# Patient Record
Sex: Male | Born: 2000 | Race: Black or African American | Hispanic: No | Marital: Single | Smoking: Never smoker
Health system: Southern US, Community
[De-identification: ages and names within clinical notes are randomized; demographics above are authoritative.]

---
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
## 2021-07-11 IMAGING — DX DG HAND COMPLETE 3+V*R*
3 series · 3 of 3 positions shown · non-contrast
Comparison: Plain films of the right hand earlier today.

CLINICAL DATA: Right hand pain since the patient suffered a fifth
metacarpal fracture when he punched a wall several weeks ago.

EXAM:
RIGHT HAND - COMPLETE 3+ VIEW

[hand obl (1 of 2)]
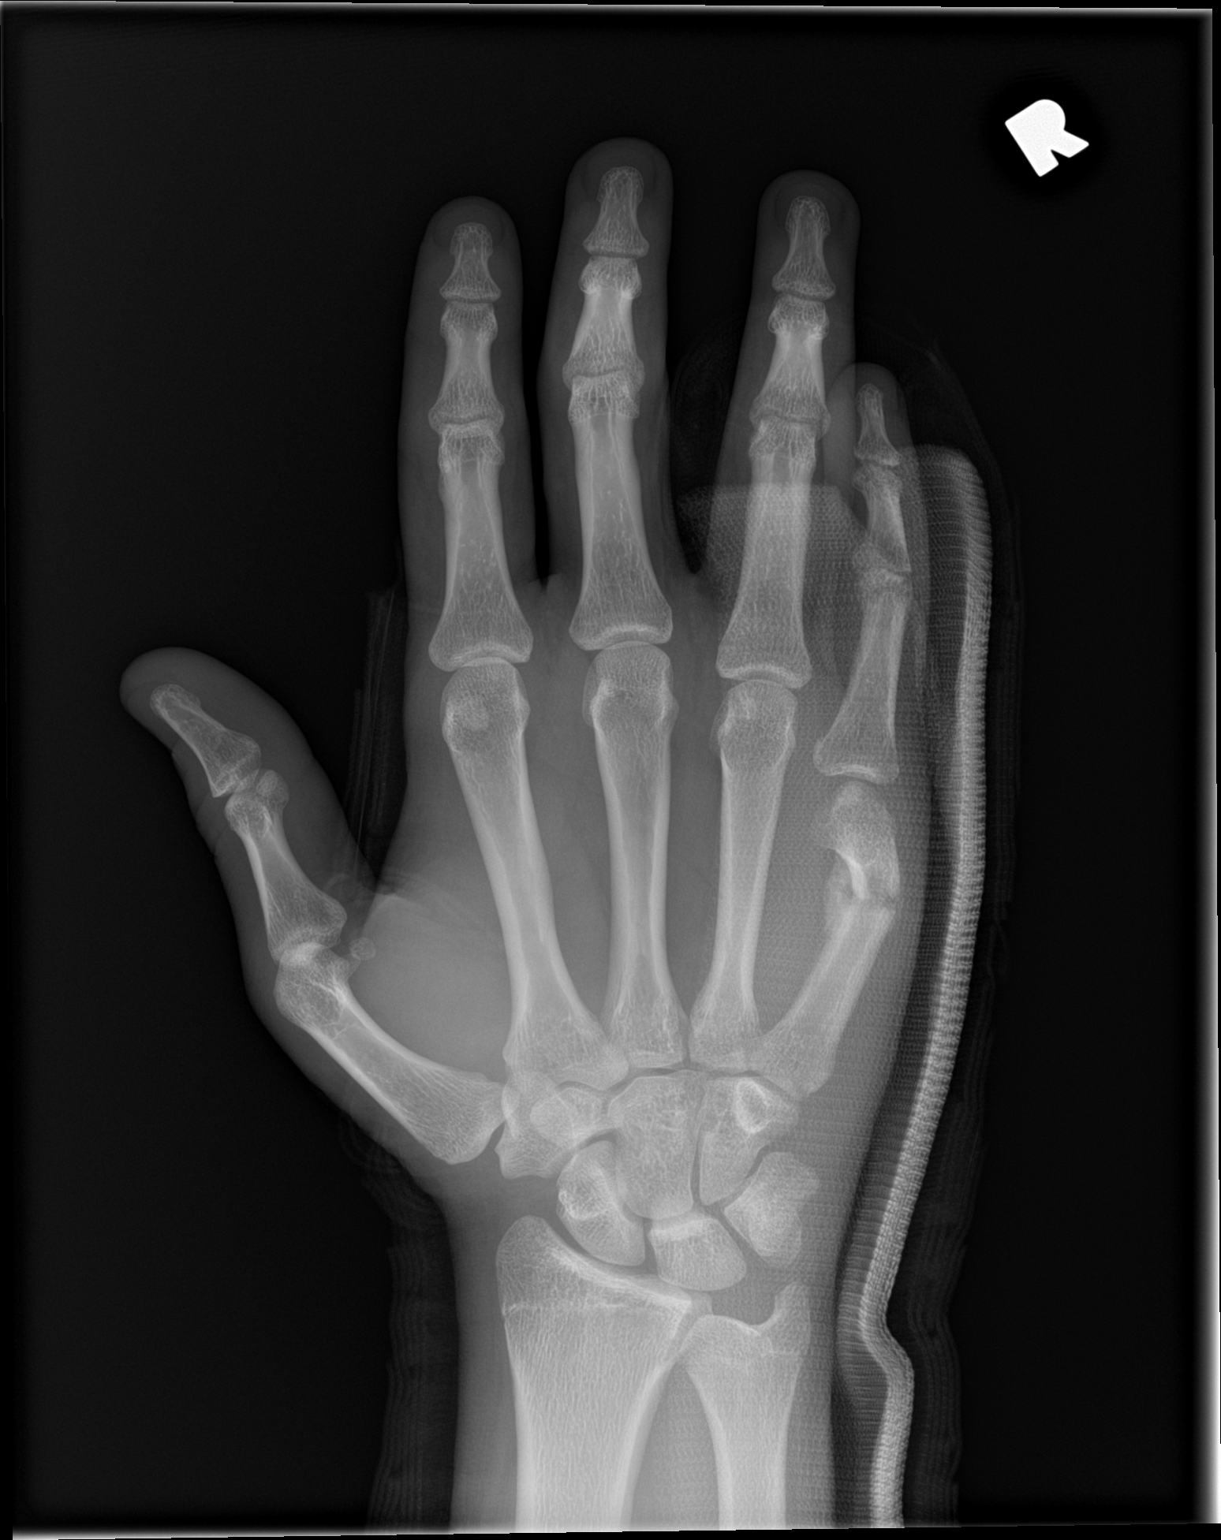

[hand lat]
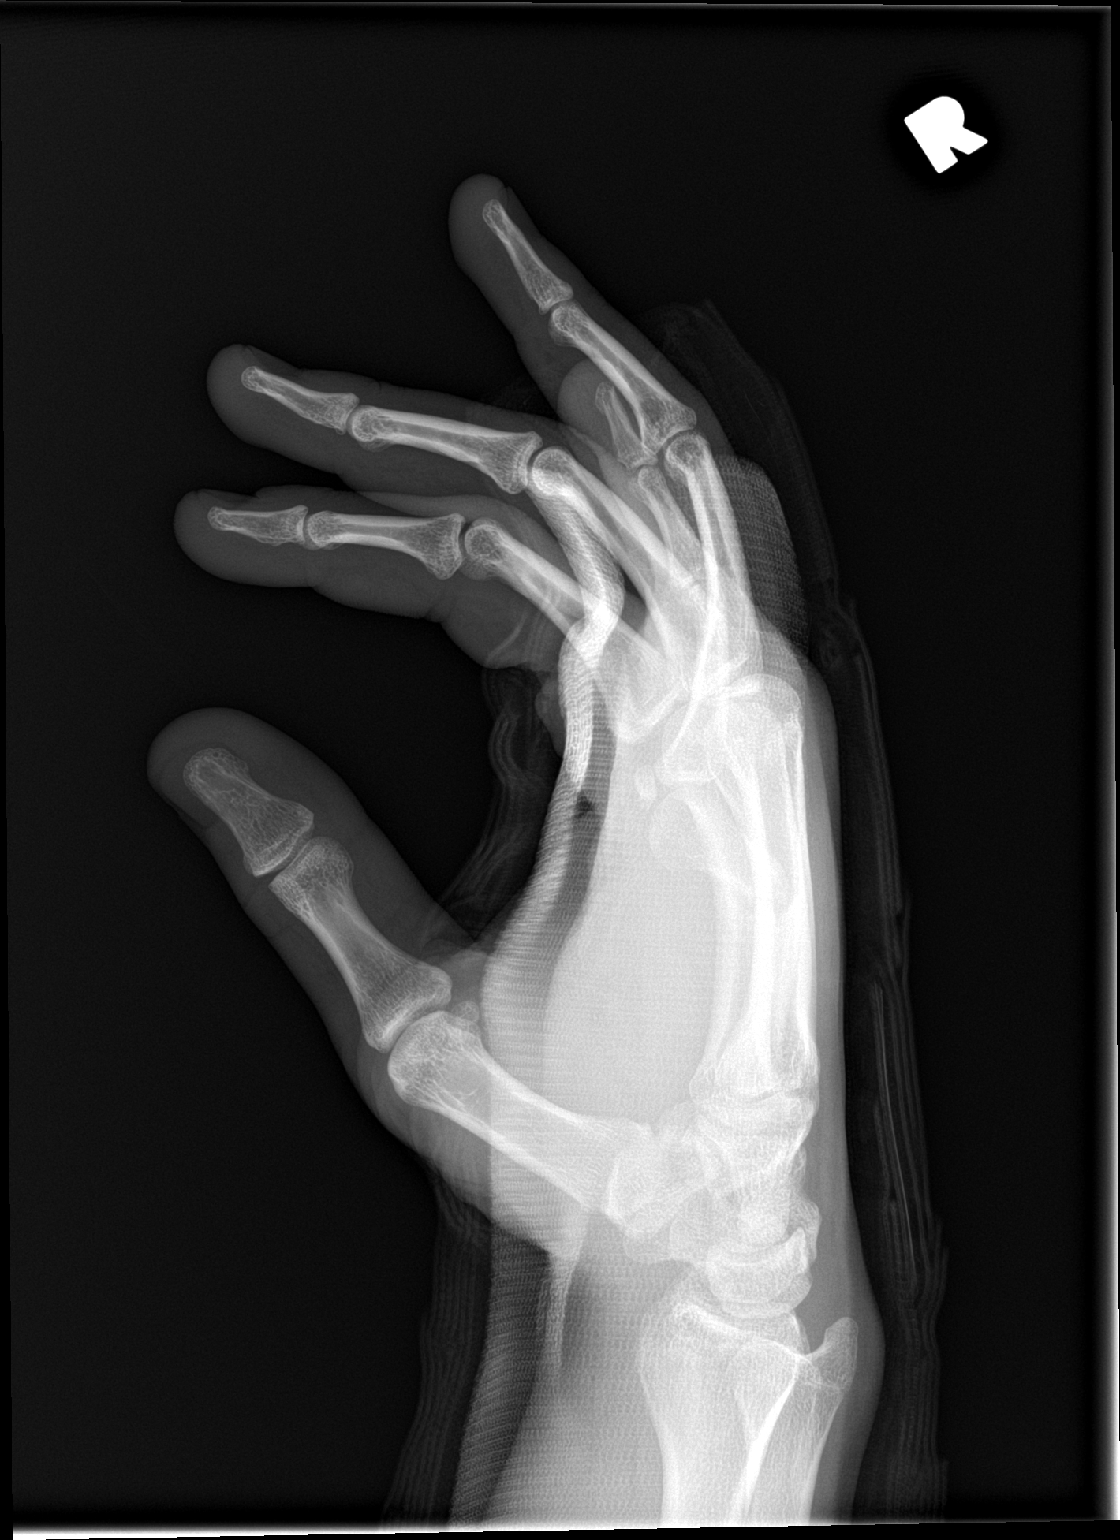

[hand obl (2 of 2)]
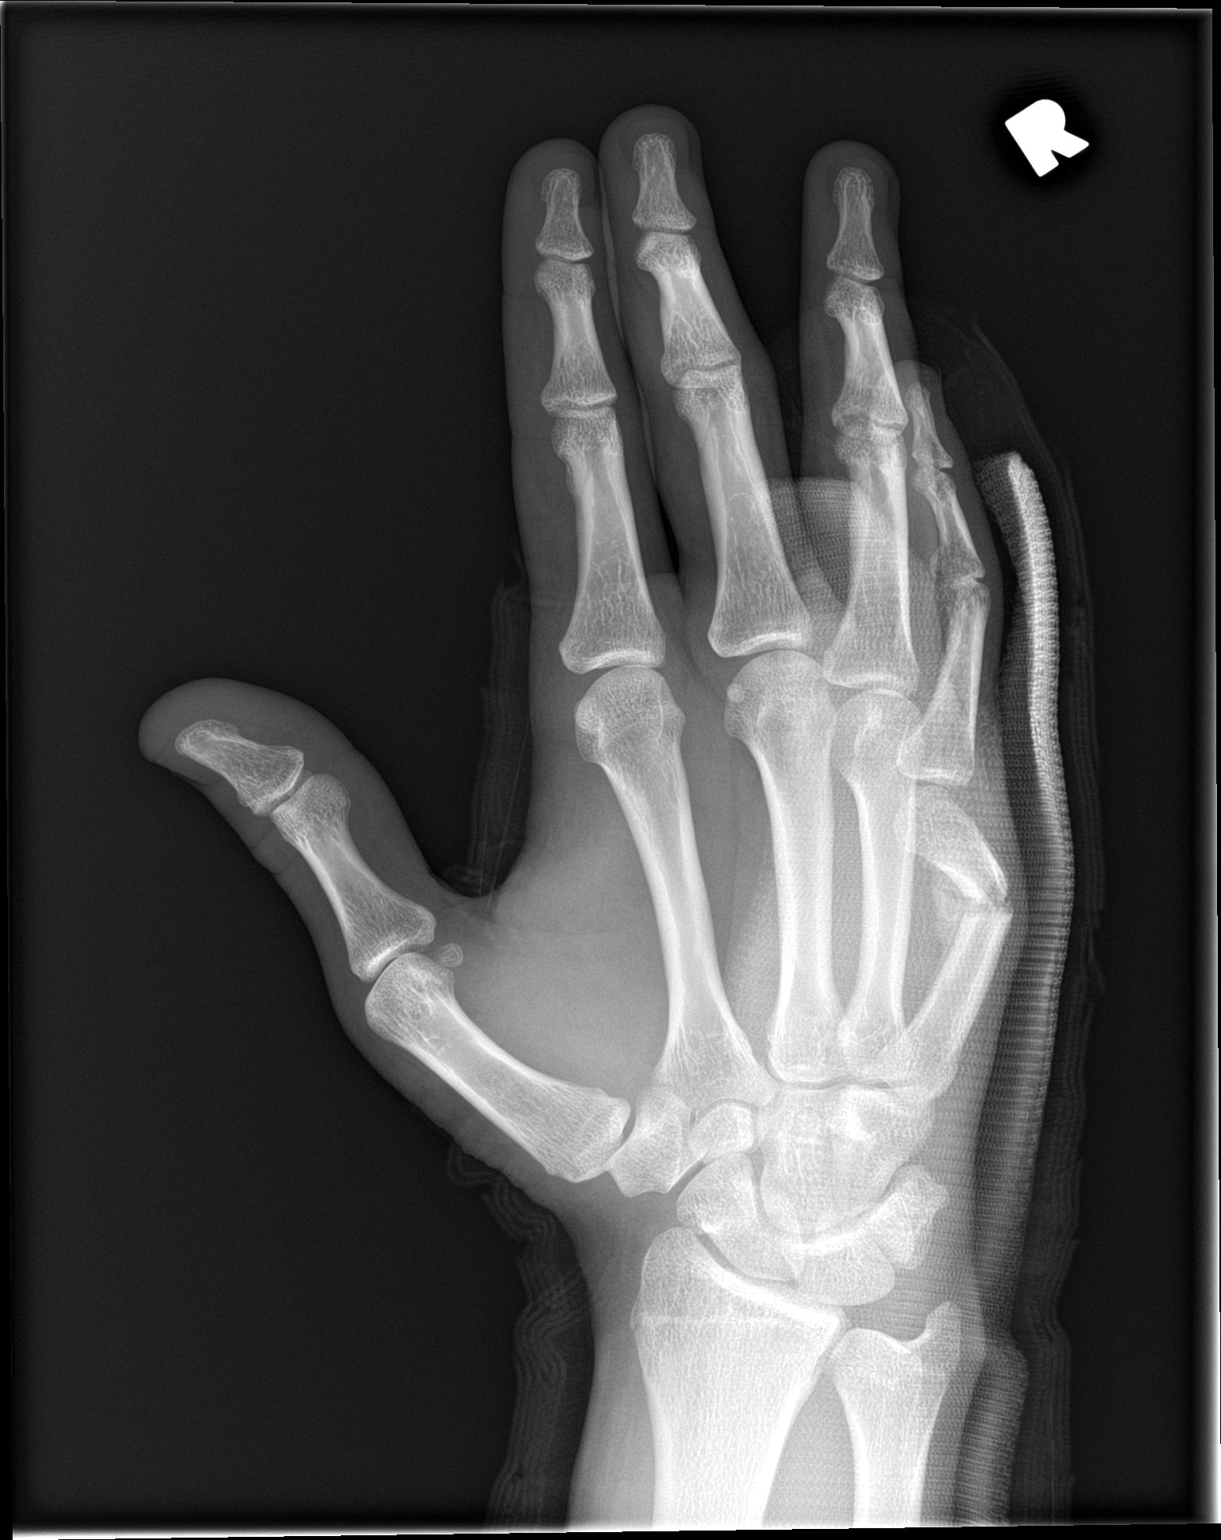

[3 of 3 positions shown; findings below may reference images not displayed]

FINDINGS: Fiberglass splint is now in place. Position and alignment of the
patient's fracture of the distal fifth metacarpal are unchanged.
Callus formation again seen.
IMPRESSION: Fiberglass splint now in place. No change in position or alignment
of the patient's fifth metacarpal fracture.

## 2021-08-15 ENCOUNTER — Emergency Department (HOSPITAL_BASED_OUTPATIENT_CLINIC_OR_DEPARTMENT_OTHER)
Admission: EM | Admit: 2021-08-15 | Discharge: 2021-08-15 | Disposition: A | Discharge status: Home / Self Care | Payer: Medicaid Other | Attending: Emergency Medicine | Admitting: Emergency Medicine

## 2021-08-15 ENCOUNTER — Other Ambulatory Visit: Payer: Self-pay

## 2021-08-15 ENCOUNTER — Encounter (HOSPITAL_BASED_OUTPATIENT_CLINIC_OR_DEPARTMENT_OTHER): Payer: Self-pay

## 2021-08-15 DIAGNOSIS — Z5321 Procedure and treatment not carried out due to patient leaving prior to being seen by health care provider: Secondary | ICD-10-CM | POA: Insufficient documentation

## 2021-08-15 DIAGNOSIS — H1031 Unspecified acute conjunctivitis, right eye: Secondary | ICD-10-CM | POA: Insufficient documentation

## 2021-08-15 NOTE — ED Triage Notes (Addendum)
Pt reports multiple family members have recently had conjunctivitis and states now he states he has it. He has been prescribed eye drops by doctors at [facility] for it and states they are not working. Right eye conjunctiva is red, onset last week.

## 2021-11-30 ENCOUNTER — Other Ambulatory Visit: Payer: Self-pay

## 2021-11-30 ENCOUNTER — Emergency Department (HOSPITAL_BASED_OUTPATIENT_CLINIC_OR_DEPARTMENT_OTHER)
Admission: EM | Admit: 2021-11-30 | Discharge: 2021-11-30 | Discharge status: Against Medical Advice | Payer: Medicaid Other | Attending: Emergency Medicine | Admitting: Emergency Medicine

## 2021-11-30 ENCOUNTER — Encounter (HOSPITAL_BASED_OUTPATIENT_CLINIC_OR_DEPARTMENT_OTHER): Payer: Self-pay | Admitting: Emergency Medicine

## 2021-11-30 DIAGNOSIS — Z5321 Procedure and treatment not carried out due to patient leaving prior to being seen by health care provider: Secondary | ICD-10-CM | POA: Diagnosis not present

## 2021-11-30 DIAGNOSIS — S8992XA Unspecified injury of left lower leg, initial encounter: Secondary | ICD-10-CM | POA: Insufficient documentation

## 2021-11-30 DIAGNOSIS — W3400XA Accidental discharge from unspecified firearms or gun, initial encounter: Secondary | ICD-10-CM | POA: Insufficient documentation

## 2021-11-30 MED ORDER — HYDROCODONE-ACETAMINOPHEN 5-325 MG PO TABS: 1.0000 | ORAL_TABLET | Freq: Once | ORAL | Status: DC

## 2021-11-30 NOTE — ED Notes (Signed)
Pt called out several times. This RN to room to speak with pt. Pt demanding to be seen immediately. Advised pt it would be about an hour before the doctor could see him. Pt states he is not waiting that long in pain. Consulted with Dr. Silverio Lay who gave verbal order for Vicodin. Offered pt Vicodin for pain but advised pt that it would still be an hour for MD to see per Dr. Silverio Lay. Pt states he is not waiting. Pt provided wheelchair and taken to lobby. Family member made aware. Pt was ambulatory out of ED via crutches.
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
## 2022-10-23 ENCOUNTER — Encounter: Payer: Self-pay | Admitting: *Deleted
